# Patient Record
Sex: Male | Born: 1955 | Race: White | Hispanic: No | Smoking: Current every day smoker
Health system: Southern US, Community
[De-identification: ages and names within clinical notes are randomized; demographics above are authoritative.]

---

## 2009-07-21 ENCOUNTER — Emergency Department: Payer: Self-pay | Admitting: Emergency Medicine

## 2017-08-02 ENCOUNTER — Emergency Department
Admission: EM | Admit: 2017-08-02 | Discharge: 2017-08-02 | Payer: No Typology Code available for payment source | Attending: Emergency Medicine | Admitting: Emergency Medicine

## 2017-08-02 ENCOUNTER — Emergency Department: Payer: No Typology Code available for payment source

## 2017-08-02 ENCOUNTER — Encounter: Payer: Self-pay | Admitting: Emergency Medicine

## 2017-08-02 ENCOUNTER — Other Ambulatory Visit: Payer: Self-pay

## 2017-08-02 DIAGNOSIS — S0031XA Abrasion of nose, initial encounter: Secondary | ICD-10-CM | POA: Insufficient documentation

## 2017-08-02 DIAGNOSIS — F17210 Nicotine dependence, cigarettes, uncomplicated: Secondary | ICD-10-CM | POA: Insufficient documentation

## 2017-08-02 DIAGNOSIS — Z0289 Encounter for other administrative examinations: Secondary | ICD-10-CM | POA: Diagnosis present

## 2017-08-02 DIAGNOSIS — Y92410 Unspecified street and highway as the place of occurrence of the external cause: Secondary | ICD-10-CM | POA: Diagnosis not present

## 2017-08-02 DIAGNOSIS — Y999 Unspecified external cause status: Secondary | ICD-10-CM | POA: Diagnosis not present

## 2017-08-02 DIAGNOSIS — T148XXA Other injury of unspecified body region, initial encounter: Secondary | ICD-10-CM

## 2017-08-02 DIAGNOSIS — Y9389 Activity, other specified: Secondary | ICD-10-CM | POA: Insufficient documentation

## 2017-08-02 DIAGNOSIS — F10920 Alcohol use, unspecified with intoxication, uncomplicated: Secondary | ICD-10-CM | POA: Diagnosis not present

## 2017-08-02 LAB — COMPREHENSIVE METABOLIC PANEL
ALBUMIN: 4 g/dL (ref 3.5–5.0)
ALT: 12 U/L — ABNORMAL LOW (ref 17–63)
AST: 28 U/L (ref 15–41)
Alkaline Phosphatase: 75 U/L (ref 38–126)
Anion gap: 12 (ref 5–15)
BUN: 6 mg/dL (ref 6–20)
CHLORIDE: 102 mmol/L (ref 101–111)
CO2: 23 mmol/L (ref 22–32)
Calcium: 8.5 mg/dL — ABNORMAL LOW (ref 8.9–10.3)
Creatinine, Ser: 0.76 mg/dL (ref 0.61–1.24)
GFR calc Af Amer: >60 mL/min (ref >60)
GFR calc non Af Amer: >60 mL/min (ref >60)
Glucose, Bld: 110 mg/dL — ABNORMAL HIGH (ref 65–99)
POTASSIUM: 3.8 mmol/L (ref 3.5–5.1)
SODIUM: 137 mmol/L (ref 135–145)
Total Bilirubin: 0.3 mg/dL (ref 0.3–1.2)
Total Protein: 7.7 g/dL (ref 6.5–8.1)

## 2017-08-02 LAB — URINALYSIS, ROUTINE W REFLEX MICROSCOPIC
Bilirubin Urine: NEGATIVE
Glucose, UA: NEGATIVE mg/dL
Hgb urine dipstick: NEGATIVE
Ketones, ur: NEGATIVE mg/dL
Leukocytes, UA: NEGATIVE
Nitrite: NEGATIVE
Protein, ur: NEGATIVE mg/dL
SPECIFIC GRAVITY, URINE: 1.006 (ref 1.005–1.030)
pH: 7 (ref 5.0–8.0)

## 2017-08-02 LAB — CBC
HCT: 49.3 % (ref 40.0–52.0)
Hemoglobin: 16.9 g/dL (ref 13.0–18.0)
MCH: 34.7 pg — ABNORMAL HIGH (ref 26.0–34.0)
MCHC: 34.3 g/dL (ref 32.0–36.0)
MCV: 101.1 fL — ABNORMAL HIGH (ref 80.0–100.0)
PLATELETS: 226 10*3/uL (ref 150–440)
RBC: 4.88 MIL/uL (ref 4.40–5.90)
RDW: 15.2 % — ABNORMAL HIGH (ref 11.5–14.5)
WBC: 5.8 10*3/uL (ref 3.8–10.6)

## 2017-08-02 LAB — URINE DRUG SCREEN, QUALITATIVE (ARMC ONLY)
Amphetamines, Ur Screen: NOT DETECTED
Barbiturates, Ur Screen: NOT DETECTED
Benzodiazepine, Ur Scrn: NOT DETECTED
COCAINE METABOLITE, UR ~~LOC~~: NOT DETECTED
Cannabinoid 50 Ng, Ur ~~LOC~~: NOT DETECTED
MDMA (ECSTASY) UR SCREEN: NOT DETECTED
METHADONE SCREEN, URINE: NOT DETECTED
Opiate, Ur Screen: NOT DETECTED
Phencyclidine (PCP) Ur S: NOT DETECTED
TRICYCLIC, UR SCREEN: NOT DETECTED

## 2017-08-02 LAB — ETHANOL: ALCOHOL ETHYL (B): 326 mg/dL — AB (ref <10)

## 2017-08-02 NOTE — ED Notes (Signed)
Handcuffs removed for lab work

## 2017-08-02 NOTE — ED Notes (Signed)
Forensic blood draw completed, used betadine for prep, 2 lab tubes collected for officer [place].  Officers have search warrant present.
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
## 2017-08-02 NOTE — ED Notes (Signed)
Pt has small abrasion to left wrist from handcuffs, pt is not currently in handcuffs, wound cleansed with NS and wrapped with gauze.
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
## 2017-08-02 NOTE — ED Notes (Signed)
D/w Dr. Darnelle CatalanMalinda, new orders received.

## 2017-08-02 NOTE — ED Notes (Signed)
Pt seen and evaluated by Dr. Cyril LoosenKinner. Patient was released into the custody of [place] [place] [employer] [place]. Pt stable at the time of discharge.
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
## 2020-08-18 ENCOUNTER — Other Ambulatory Visit: Payer: Self-pay

## 2020-08-18 ENCOUNTER — Inpatient Hospital Stay
Admit: 2020-08-18 | Discharge: 2020-08-18 | Disposition: A | Discharge status: Home / Self Care | Payer: Self-pay | Attending: Internal Medicine | Admitting: Internal Medicine

## 2020-08-18 ENCOUNTER — Inpatient Hospital Stay
Admission: EM | Admit: 2020-08-18 | Discharge: 2020-08-20 | DRG: 871 | Disposition: A | Discharge status: Home / Self Care | Payer: Self-pay | Attending: Internal Medicine | Admitting: Internal Medicine

## 2020-08-18 ENCOUNTER — Emergency Department: Payer: Self-pay

## 2020-08-18 DIAGNOSIS — A419 Sepsis, unspecified organism: Principal | ICD-10-CM

## 2020-08-18 DIAGNOSIS — Z79899 Other long term (current) drug therapy: Secondary | ICD-10-CM

## 2020-08-18 DIAGNOSIS — J159 Unspecified bacterial pneumonia: Secondary | ICD-10-CM | POA: Diagnosis present

## 2020-08-18 DIAGNOSIS — U071 COVID-19: Secondary | ICD-10-CM | POA: Diagnosis present

## 2020-08-18 DIAGNOSIS — E876 Hypokalemia: Secondary | ICD-10-CM | POA: Diagnosis present

## 2020-08-18 DIAGNOSIS — Z720 Tobacco use: Secondary | ICD-10-CM

## 2020-08-18 DIAGNOSIS — J9601 Acute respiratory failure with hypoxia: Secondary | ICD-10-CM | POA: Diagnosis present

## 2020-08-18 DIAGNOSIS — F17210 Nicotine dependence, cigarettes, uncomplicated: Secondary | ICD-10-CM | POA: Diagnosis present

## 2020-08-18 DIAGNOSIS — E871 Hypo-osmolality and hyponatremia: Secondary | ICD-10-CM | POA: Diagnosis present

## 2020-08-18 DIAGNOSIS — R911 Solitary pulmonary nodule: Secondary | ICD-10-CM

## 2020-08-18 DIAGNOSIS — J189 Pneumonia, unspecified organism: Secondary | ICD-10-CM

## 2020-08-18 DIAGNOSIS — D6959 Other secondary thrombocytopenia: Secondary | ICD-10-CM | POA: Diagnosis present

## 2020-08-18 DIAGNOSIS — R6520 Severe sepsis without septic shock: Secondary | ICD-10-CM

## 2020-08-18 DIAGNOSIS — I5033 Acute on chronic diastolic (congestive) heart failure: Secondary | ICD-10-CM | POA: Diagnosis present

## 2020-08-18 DIAGNOSIS — A4189 Other specified sepsis: Principal | ICD-10-CM | POA: Diagnosis present

## 2020-08-18 DIAGNOSIS — J1282 Pneumonia due to coronavirus disease 2019: Secondary | ICD-10-CM

## 2020-08-18 DIAGNOSIS — E878 Other disorders of electrolyte and fluid balance, not elsewhere classified: Secondary | ICD-10-CM | POA: Diagnosis present

## 2020-08-18 DIAGNOSIS — F1010 Alcohol abuse, uncomplicated: Secondary | ICD-10-CM | POA: Diagnosis present

## 2020-08-18 DIAGNOSIS — I110 Hypertensive heart disease with heart failure: Secondary | ICD-10-CM | POA: Diagnosis present

## 2020-08-18 LAB — PROCALCITONIN
Procalcitonin: 2.58 ng/mL
Procalcitonin: 5.26 ng/mL

## 2020-08-18 LAB — COMPREHENSIVE METABOLIC PANEL
ALT: 18 U/L (ref 0–44)
AST: 47 U/L — ABNORMAL HIGH (ref 15–41)
Albumin: 3.4 g/dL — ABNORMAL LOW (ref 3.5–5.0)
Alkaline Phosphatase: 35 U/L — ABNORMAL LOW (ref 38–126)
Anion gap: 14 (ref 5–15)
BUN: 21 mg/dL (ref 8–23)
CO2: 22 mmol/L (ref 22–32)
Calcium: 8.4 mg/dL — ABNORMAL LOW (ref 8.9–10.3)
Chloride: 91 mmol/L — ABNORMAL LOW (ref 98–111)
Creatinine, Ser: 0.91 mg/dL (ref 0.61–1.24)
GFR, Estimated: >60 mL/min (ref >60)
Glucose, Bld: 189 mg/dL — ABNORMAL HIGH (ref 70–99)
Potassium: 3.3 mmol/L — ABNORMAL LOW (ref 3.5–5.1)
Sodium: 127 mmol/L — ABNORMAL LOW (ref 135–145)
Total Bilirubin: 0.8 mg/dL (ref 0.3–1.2)
Total Protein: 7.2 g/dL (ref 6.5–8.1)

## 2020-08-18 LAB — URINALYSIS, COMPLETE (UACMP) WITH MICROSCOPIC
Bacteria, UA: NONE SEEN
Bilirubin Urine: NEGATIVE
Glucose, UA: NEGATIVE mg/dL
Ketones, ur: 20 mg/dL — AB
Leukocytes,Ua: NEGATIVE
Nitrite: NEGATIVE
Protein, ur: 100 mg/dL — AB
Specific Gravity, Urine: 1.018 (ref 1.005–1.030)
pH: 5 (ref 5.0–8.0)

## 2020-08-18 LAB — LACTIC ACID, PLASMA
Lactic Acid, Venous: 5.1 mmol/L (ref 0.5–1.9)
Lactic Acid, Venous: 1.2 mmol/L (ref 0.5–1.9)

## 2020-08-18 LAB — CBC
HCT: 36.9 % — ABNORMAL LOW (ref 39.0–52.0)
Hemoglobin: 13.5 g/dL (ref 13.0–17.0)
MCH: 32.6 pg (ref 26.0–34.0)
MCHC: 36.6 g/dL — ABNORMAL HIGH (ref 30.0–36.0)
MCV: 89.1 fL (ref 80.0–100.0)
Platelets: 146 10*3/uL — ABNORMAL LOW (ref 150–400)
RBC: 4.14 MIL/uL — ABNORMAL LOW (ref 4.22–5.81)
RDW: 13.9 % (ref 11.5–15.5)
WBC: 7.4 10*3/uL (ref 4.0–10.5)
nRBC: 0 % (ref 0.0–0.2)

## 2020-08-18 LAB — CBC WITH DIFFERENTIAL/PLATELET
Abs Immature Granulocytes: 0.05 10*3/uL (ref 0.00–0.07)
Basophils Absolute: 0 10*3/uL (ref 0.0–0.1)
Basophils Relative: 0 %
Eosinophils Absolute: 0 10*3/uL (ref 0.0–0.5)
Eosinophils Relative: 0 %
HCT: 37.7 % — ABNORMAL LOW (ref 39.0–52.0)
Hemoglobin: 13.5 g/dL (ref 13.0–17.0)
Immature Granulocytes: 1 %
Lymphocytes Relative: 8 %
Lymphs Abs: 0.6 10*3/uL — ABNORMAL LOW (ref 0.7–4.0)
MCH: 32.5 pg (ref 26.0–34.0)
MCHC: 35.8 g/dL (ref 30.0–36.0)
MCV: 90.8 fL (ref 80.0–100.0)
Monocytes Absolute: 0.6 10*3/uL (ref 0.1–1.0)
Monocytes Relative: 7 %
Neutro Abs: 6.8 10*3/uL (ref 1.7–7.7)
Neutrophils Relative %: 84 %
Platelets: 129 10*3/uL — ABNORMAL LOW (ref 150–400)
RBC: 4.15 MIL/uL — ABNORMAL LOW (ref 4.22–5.81)
RDW: 13.8 % (ref 11.5–15.5)
WBC: 8.1 10*3/uL (ref 4.0–10.5)
nRBC: 0 % (ref 0.0–0.2)

## 2020-08-18 LAB — GLUCOSE, CAPILLARY: Glucose-Capillary: 163 mg/dL — ABNORMAL HIGH (ref 70–99)

## 2020-08-18 LAB — MAGNESIUM
Magnesium: 1.5 mg/dL — ABNORMAL LOW (ref 1.7–2.4)
Magnesium: 2.1 mg/dL (ref 1.7–2.4)

## 2020-08-18 LAB — PROTIME-INR
INR: 1.1 (ref 0.8–1.2)
Prothrombin Time: 13.3 seconds (ref 11.4–15.2)

## 2020-08-18 LAB — FIBRIN DERIVATIVES D-DIMER (ARMC ONLY): Fibrin derivatives D-dimer (ARMC): 2289.78 ng/mL (FEU) — ABNORMAL HIGH (ref 0.00–499.00)

## 2020-08-18 LAB — APTT: aPTT: 34 seconds (ref 24–36)

## 2020-08-18 LAB — FIBRINOGEN: Fibrinogen: 500 mg/dL — ABNORMAL HIGH (ref 210–475)

## 2020-08-18 LAB — LIPASE, BLOOD: Lipase: 28 U/L (ref 11–51)

## 2020-08-18 LAB — PHOSPHORUS: Phosphorus: 2 mg/dL — ABNORMAL LOW (ref 2.5–4.6)

## 2020-08-18 LAB — LACTATE DEHYDROGENASE: LDH: 223 U/L — ABNORMAL HIGH (ref 98–192)

## 2020-08-18 LAB — BRAIN NATRIURETIC PEPTIDE: B Natriuretic Peptide: 318.8 pg/mL — ABNORMAL HIGH (ref 0.0–100.0)

## 2020-08-18 LAB — C-REACTIVE PROTEIN: CRP: 15.5 mg/dL — ABNORMAL HIGH (ref <1.0)

## 2020-08-18 LAB — HIV ANTIBODY (ROUTINE TESTING W REFLEX): HIV Screen 4th Generation wRfx: NONREACTIVE

## 2020-08-18 LAB — TROPONIN I (HIGH SENSITIVITY): Troponin I (High Sensitivity): 62 ng/L — ABNORMAL HIGH (ref <18)

## 2020-08-18 LAB — SARS CORONAVIRUS 2 BY RT PCR (HOSPITAL ORDER, PERFORMED IN ~~LOC~~ HOSPITAL LAB): SARS Coronavirus 2: POSITIVE — AB

## 2020-08-18 LAB — FERRITIN: Ferritin: 1539 ng/mL — ABNORMAL HIGH (ref 24–336)

## 2020-08-18 MED ORDER — VITAMIN D 25 MCG (1000 UNIT) PO TABS
1000.0000 [IU] | ORAL_TABLET | Freq: Every day | ORAL | Status: DC
  Administered 2020-08-18 – 2020-08-20 (×3): 1000 [IU] via ORAL
  Filled 2020-08-18 (×3): qty 1

## 2020-08-18 MED ORDER — SODIUM CHLORIDE 0.9 % IV SOLN
200.0000 mg | Freq: Once | INTRAVENOUS | Status: AC
  Administered 2020-08-18: 200 mg via INTRAVENOUS
  Filled 2020-08-18: qty 200

## 2020-08-18 MED ORDER — LABETALOL HCL 5 MG/ML IV SOLN: 20.0000 mg | INTRAVENOUS | Status: DC | PRN

## 2020-08-18 MED ORDER — POTASSIUM CHLORIDE CRYS ER 20 MEQ PO TBCR
40.0000 meq | EXTENDED_RELEASE_TABLET | Freq: Once | ORAL | Status: AC
  Administered 2020-08-18: 40 meq via ORAL
  Filled 2020-08-18: qty 2

## 2020-08-18 MED ORDER — PREDNISONE 50 MG PO TABS: 50.0000 mg | ORAL_TABLET | Freq: Every day | ORAL | Status: DC

## 2020-08-18 MED ORDER — CHLORDIAZEPOXIDE HCL 25 MG PO CAPS
25.0000 mg | ORAL_CAPSULE | Freq: Once | ORAL | Status: AC
  Administered 2020-08-18: 25 mg via ORAL
  Filled 2020-08-18: qty 1

## 2020-08-18 MED ORDER — FOLIC ACID 1 MG PO TABS
1.0000 mg | ORAL_TABLET | Freq: Every day | ORAL | Status: DC
  Administered 2020-08-18 – 2020-08-20 (×3): 1 mg via ORAL
  Filled 2020-08-18 (×3): qty 1

## 2020-08-18 MED ORDER — ALBUTEROL SULFATE HFA 108 (90 BASE) MCG/ACT IN AERS
2.0000 | INHALATION_SPRAY | Freq: Four times a day (QID) | RESPIRATORY_TRACT | Status: DC | PRN
  Filled 2020-08-18: qty 6.7

## 2020-08-18 MED ORDER — ADULT MULTIVITAMIN W/MINERALS CH
1.0000 | ORAL_TABLET | Freq: Every day | ORAL | Status: DC
  Administered 2020-08-18 – 2020-08-20 (×3): 1 via ORAL
  Filled 2020-08-18 (×3): qty 1

## 2020-08-18 MED ORDER — AMLODIPINE BESYLATE 5 MG PO TABS
5.0000 mg | ORAL_TABLET | Freq: Every day | ORAL | Status: DC
  Administered 2020-08-18 – 2020-08-20 (×3): 5 mg via ORAL
  Filled 2020-08-18 (×3): qty 1

## 2020-08-18 MED ORDER — FAMOTIDINE 20 MG PO TABS
20.0000 mg | ORAL_TABLET | Freq: Two times a day (BID) | ORAL | Status: DC
  Administered 2020-08-18 – 2020-08-20 (×5): 20 mg via ORAL
  Filled 2020-08-18 (×5): qty 1

## 2020-08-18 MED ORDER — POTASSIUM CHLORIDE 10 MEQ/100ML IV SOLN: 10.0000 meq | INTRAVENOUS | Status: DC

## 2020-08-18 MED ORDER — ONDANSETRON HCL 4 MG PO TABS: 4.0000 mg | ORAL_TABLET | Freq: Four times a day (QID) | ORAL | Status: DC | PRN

## 2020-08-18 MED ORDER — CHOLECALCIFEROL 1.25 MG (50000 UT) PO CAPS: 50000.0000 [IU] | ORAL_CAPSULE | ORAL | Status: DC

## 2020-08-18 MED ORDER — ZINC SULFATE 220 (50 ZN) MG PO CAPS
220.0000 mg | ORAL_CAPSULE | Freq: Every day | ORAL | Status: DC
  Administered 2020-08-18 – 2020-08-20 (×3): 220 mg via ORAL
  Filled 2020-08-18 (×3): qty 1

## 2020-08-18 MED ORDER — ENOXAPARIN SODIUM 40 MG/0.4ML ~~LOC~~ SOLN
40.0000 mg | SUBCUTANEOUS | Status: DC
  Administered 2020-08-19 – 2020-08-20 (×2): 40 mg via SUBCUTANEOUS
  Filled 2020-08-18 (×3): qty 0.4

## 2020-08-18 MED ORDER — ONDANSETRON HCL 4 MG/2ML IJ SOLN: 4.0000 mg | Freq: Four times a day (QID) | INTRAMUSCULAR | Status: DC | PRN

## 2020-08-18 MED ORDER — LORAZEPAM 2 MG/ML IJ SOLN: 1.0000 mg | INTRAMUSCULAR | Status: DC | PRN

## 2020-08-18 MED ORDER — SODIUM CHLORIDE 0.9 % IV SOLN
500.0000 mg | INTRAVENOUS | Status: DC
  Administered 2020-08-18 – 2020-08-20 (×3): 500 mg via INTRAVENOUS
  Filled 2020-08-18 (×4): qty 500

## 2020-08-18 MED ORDER — POTASSIUM CHLORIDE IN NACL 20-0.9 MEQ/L-% IV SOLN: INTRAVENOUS | Status: DC

## 2020-08-18 MED ORDER — THIAMINE HCL 100 MG PO TABS
100.0000 mg | ORAL_TABLET | Freq: Every day | ORAL | Status: DC
  Administered 2020-08-18 – 2020-08-20 (×3): 100 mg via ORAL
  Filled 2020-08-18 (×3): qty 1

## 2020-08-18 MED ORDER — MAGNESIUM HYDROXIDE 400 MG/5ML PO SUSP
30.0000 mL | Freq: Every day | ORAL | Status: DC | PRN
  Filled 2020-08-18: qty 30

## 2020-08-18 MED ORDER — DEXAMETHASONE SODIUM PHOSPHATE 10 MG/ML IJ SOLN
6.0000 mg | Freq: Once | INTRAMUSCULAR | Status: AC
  Administered 2020-08-18: 6 mg via INTRAVENOUS
  Filled 2020-08-18: qty 1

## 2020-08-18 MED ORDER — LACTATED RINGERS IV BOLUS (SEPSIS)
2000.0000 mL | Freq: Once | INTRAVENOUS | Status: AC
  Administered 2020-08-18: 2000 mL via INTRAVENOUS

## 2020-08-18 MED ORDER — ACETAMINOPHEN 500 MG PO TABS
1000.0000 mg | ORAL_TABLET | Freq: Once | ORAL | Status: AC
  Administered 2020-08-18: 1000 mg via ORAL
  Filled 2020-08-18: qty 2

## 2020-08-18 MED ORDER — MIRTAZAPINE 15 MG PO TABS
15.0000 mg | ORAL_TABLET | Freq: Every day | ORAL | Status: DC
Start: 2020-08-18 — End: 2020-08-20
  Administered 2020-08-18 – 2020-08-19 (×2): 15 mg via ORAL
  Filled 2020-08-18 (×2): qty 1

## 2020-08-18 MED ORDER — LACTATED RINGERS IV SOLN: INTRAVENOUS | Status: AC

## 2020-08-18 MED ORDER — HYDROCOD POLST-CPM POLST ER 10-8 MG/5ML PO SUER: 5.0000 mL | Freq: Two times a day (BID) | ORAL | Status: DC | PRN

## 2020-08-18 MED ORDER — FUROSEMIDE 10 MG/ML IJ SOLN
40.0000 mg | Freq: Once | INTRAMUSCULAR | Status: AC
  Administered 2020-08-18: 40 mg via INTRAVENOUS
  Filled 2020-08-18: qty 4

## 2020-08-18 MED ORDER — METHYLPREDNISOLONE SODIUM SUCC 125 MG IJ SOLR
1.0000 mg/kg | Freq: Two times a day (BID) | INTRAMUSCULAR | Status: DC
  Administered 2020-08-18 – 2020-08-19 (×2): 83.75 mg via INTRAVENOUS
  Filled 2020-08-18 (×2): qty 2

## 2020-08-18 MED ORDER — LOSARTAN POTASSIUM 25 MG PO TABS
25.0000 mg | ORAL_TABLET | Freq: Every day | ORAL | Status: DC
  Administered 2020-08-18 – 2020-08-20 (×3): 25 mg via ORAL
  Filled 2020-08-18 (×3): qty 1

## 2020-08-18 MED ORDER — POTASSIUM CHLORIDE 10 MEQ/100ML IV SOLN
10.0000 meq | Freq: Once | INTRAVENOUS | Status: AC
  Administered 2020-08-18: 10 meq via INTRAVENOUS
  Filled 2020-08-18 (×2): qty 100

## 2020-08-18 MED ORDER — ACETAMINOPHEN 325 MG PO TABS: 650.0000 mg | ORAL_TABLET | Freq: Four times a day (QID) | ORAL | Status: DC | PRN

## 2020-08-18 MED ORDER — LORAZEPAM 1 MG PO TABS: 1.0000 mg | ORAL_TABLET | ORAL | Status: DC | PRN

## 2020-08-18 MED ORDER — ASPIRIN EC 81 MG PO TBEC
81.0000 mg | DELAYED_RELEASE_TABLET | Freq: Every day | ORAL | Status: DC
  Administered 2020-08-18 – 2020-08-20 (×3): 81 mg via ORAL
  Filled 2020-08-18 (×3): qty 1

## 2020-08-18 MED ORDER — TRAZODONE HCL 50 MG PO TABS
25.0000 mg | ORAL_TABLET | Freq: Every evening | ORAL | Status: DC | PRN
  Filled 2020-08-18: qty 1

## 2020-08-18 MED ORDER — SODIUM CHLORIDE 0.9 % IV SOLN
100.0000 mg | Freq: Every day | INTRAVENOUS | Status: DC
  Administered 2020-08-19 – 2020-08-20 (×2): 100 mg via INTRAVENOUS
  Filled 2020-08-18 (×2): qty 20

## 2020-08-18 MED ORDER — MAGNESIUM SULFATE 2 GM/50ML IV SOLN
2.0000 g | Freq: Once | INTRAVENOUS | Status: AC
  Administered 2020-08-18: 2 g via INTRAVENOUS
  Filled 2020-08-18: qty 50

## 2020-08-18 MED ORDER — ASCORBIC ACID 500 MG PO TABS
500.0000 mg | ORAL_TABLET | Freq: Every day | ORAL | Status: DC
  Administered 2020-08-18 – 2020-08-20 (×3): 500 mg via ORAL
  Filled 2020-08-18 (×3): qty 1

## 2020-08-18 MED ORDER — GUAIFENESIN-DM 100-10 MG/5ML PO SYRP: 10.0000 mL | ORAL_SOLUTION | ORAL | Status: DC | PRN

## 2020-08-18 MED ORDER — SODIUM CHLORIDE 0.9 % IV SOLN
2.0000 g | INTRAVENOUS | Status: DC
  Administered 2020-08-18 – 2020-08-20 (×3): 2 g via INTRAVENOUS
  Filled 2020-08-18 (×4): qty 20

## 2020-08-18 NOTE — Progress Notes (Signed)
*  PRELIMINARY RESULTS* Echocardiogram 2D Echocardiogram has been performed.  Gary Benton Agnes 08/18/2020, 11:25 AM

## 2020-08-18 NOTE — ED Triage Notes (Signed)
Pt presents to ER via [facility] c/o weakness, cough, unable to walk x3 days.  Pt tachypneic, tachycardic, and febrile on arrival.  Per ems, pt's temp was 102.9 en route.  Pt denies any pain at this time.  Pt does endorse drinking tequila on a regular basis, but does not remember last time he drank anything.  PT A&O x4 at this time.

## 2020-08-18 NOTE — Sepsis Progress Note (Addendum)
Following for sepsis monitoring   Notified bedside nurse of need to draw repeat lactic acid.

## 2020-08-18 NOTE — ED Notes (Signed)
Pt given cell phone, charger and shirt brought by visitor.

## 2020-08-18 NOTE — Progress Notes (Signed)
CODE SEPSIS - PHARMACY COMMUNICATION  **Broad Spectrum Antibiotics should be administered within 1 hour of Sepsis diagnosis**  Time Code Sepsis Called/Page Received: 0128  Antibiotics Ordered: Ceftriaxone and Azithromycin  Time of 1st antibiotic administration: 0155  Otelia Sergeant, PharmD, [facility] 08/18/2020 2:19 AM

## 2020-08-18 NOTE — Sepsis Progress Note (Signed)
Sepsis monitoring complete 

## 2020-08-18 NOTE — Progress Notes (Signed)
Remdesivir - Pharmacy Brief Note   O:  CXR:"Bilateral rounded pulmonary opacities that appear to be new since 2019. Follow-up with an outpatient CT is recommended for further Evaluation." SpO2: 92-94% on RA   A/P:  Remdesivir 200 mg IVPB once followed by 100 mg IVPB daily x 4 days.   Otelia Sergeant, PharmD, [facility] 08/18/2020 3:02 AM

## 2020-08-18 NOTE — Progress Notes (Signed)
PROGRESS NOTE    Gary So.  HFW:[id number] DOB: 11-27-1955 DOA: 08/18/2020 PCP: [facility]    Brief Narrative:  Gary Benton  is a 65 y.o. Caucasian male with a known history of hypertension, ongoing tobacco and alcohol abuse, who presented to the emergency room with acute onset of worsening dyspnea with productive cough of yellowish sputum as well as generalized weakness which has been going on over the last couple weeks.  Upon arriving the emergency room, his oxygen saturation was 87%, was placed on 2 L oxygen.  Patient positive for Covid.  Chest x-ray showed a possible lung nodule.  Patient is treated with steroids and remdesivir.   Assessment & Plan:   Active Problems:   Sepsis due to COVID-19 (HCC)  #1.  Acute hypoxemic respiratory failure secondary to Covid and acute on chronic congestive heart failure. Has elevation of BNP, will obtain echocardiogram to evaluate LV systolic function.  Start IV Lasix.  Discontinue fluids.  2.  Covid infection. Sepsis secondary to bacterial pneumonia. Bacterial pneumonia. Patient has a profound elevation of procalcitonin level in the setting of Covid infection.  This is consistent with bacterial pneumonia. Continue Rocephin and Zithromax.  Culture pending. Continue steroids and remdesivir for Covid infection.  3.  Alcohol abuse. Continue as needed Ativan and a multivitamin.  4.  Essential hypertension. Continue as needed blood pressure medicine.  5.  Hyponatremia and hypokalemia and hypomagnesemia. Supplement potassium and magnesium.  Recheck a BMP tomorrow    DVT prophylaxis: Lovenox Code Status: Fu;; Family Communication:  Disposition Plan:  .   Status is: Inpatient  Remains inpatient appropriate because:Inpatient level of care appropriate due to severity of illness   Dispo: The patient is from: Home              Anticipated d/c is to: Home              Anticipated d/c date is: 1 day               Patient currently is not medically stable to d/c.   Difficult to place patient No        I/O last 3 completed shifts: In: 2350 [IV Piggyback:2350] Out: 500 [Urine:500] Total I/O In: 339.8 [IV Piggyback:339.8] Out: -      Consultants:   None  Procedures:   Antimicrobials: Rocephin and Zithromax Subjective: Patient feels better this morning, short of breath improving. No chest pain, no cough. No fever chills pain No dysuria hematuria   Objective: Vitals:   08/18/20 0900 08/18/20 0930 08/18/20 1030 08/18/20 1100  BP: 134/78 (!) 167/91 (!) 148/86 139/80  Pulse: 81 84 90 86  Resp: 17 16  20   Temp:      TempSrc:      SpO2: 94% (!) 89% 99% 95%  Weight:      Height:        Intake/Output Summary (Last 24 hours) at 08/18/2020 1246 Last data filed at 08/18/2020 0945 Gross per 24 hour  Intake 2689.83 ml  Output 500 ml  Net 2189.83 ml   Filed Weights   08/18/20 0128  Weight: 83.9 kg    Examination:  General exam: Appears calm and comfortable  Respiratory system: Clear to auscultation. Respiratory effort normal. Cardiovascular system: S1 & S2 heard, RRR. No JVD, murmurs, rubs, gallops or clicks. No pedal edema. Gastrointestinal system: Abdomen is nondistended, soft and nontender. No organomegaly or masses felt. Normal bowel sounds heard. Central nervous system: Alert  and oriented. No focal neurological deficits. Extremities: Symmetric 5 x 5 power. Skin: No rashes, lesions or ulcers Psychiatry:  Mood & affect appropriate.     Data Reviewed: I have personally reviewed following labs and imaging studies  CBC: Recent Labs  Lab 08/18/20 0142  WBC 8.1  NEUTROABS 6.8  HGB 13.5  HCT 37.7*  MCV 90.8  PLT 129*   Basic Metabolic Panel: Recent Labs  Lab 08/18/20 0243  NA 127*  K 3.3*  CL 91*  CO2 22  GLUCOSE 189*  BUN 21  CREATININE 0.91  CALCIUM 8.4*  MG 1.5*   GFR: Estimated Creatinine Clearance: 98.6 mL/min (by C-G formula based on  SCr of 0.91 mg/dL). Liver Function Tests: Recent Labs  Lab 08/18/20 0243  AST 47*  ALT 18  ALKPHOS 35*  BILITOT 0.8  PROT 7.2  ALBUMIN 3.4*   Recent Labs  Lab 08/18/20 0243  LIPASE 28   No results for input(s): AMMONIA in the last 168 hours. Coagulation Profile: Recent Labs  Lab 08/18/20 0142  INR 1.1   Cardiac Enzymes: No results for input(s): CKTOTAL, CKMB, CKMBINDEX, TROPONINI in the last 168 hours. BNP (last 3 results) No results for input(s): PROBNP in the last 8760 hours. HbA1C: No results for input(s): HGBA1C in the last 72 hours. CBG: No results for input(s): GLUCAP in the last 168 hours. Lipid Profile: No results for input(s): CHOL, HDL, LDLCALC, TRIG, CHOLHDL, LDLDIRECT in the last 72 hours. Thyroid Function Tests: No results for input(s): TSH, T4TOTAL, FREET4, T3FREE, THYROIDAB in the last 72 hours. Anemia Panel: Recent Labs    08/18/20 0633  FERRITIN 1,539*   Sepsis Labs: Recent Labs  Lab 08/18/20 0142 08/18/20 0243 08/18/20 0349 08/18/20 0633  PROCALCITON  --  2.58  --  5.26  LATICACIDVEN 5.1*  --  1.2  --     Recent Results (from the past 240 hour(s))  Blood Culture (routine x 2)     Status: None (Preliminary result)   Collection Time: 08/18/20  1:43 AM   Specimen: BLOOD  Result Value Ref Range Status   Specimen Description BLOOD RIGHT ANTECUBITAL  Final   Special Requests   Final    BOTTLES DRAWN AEROBIC AND ANAEROBIC Blood Culture results may not be optimal due to an excessive volume of blood received in culture bottles   Culture   Final    NO GROWTH < 12 HOURS Performed at [facility], [address], [place], [place] [address]    Report Status PENDING  Incomplete  Blood Culture (routine x 2)     Status: None (Preliminary result)   Collection Time: 08/18/20  1:43 AM   Specimen: BLOOD  Result Value Ref Range Status   Specimen Description BLOOD WRIST  Final   Special Requests   Final    BOTTLES DRAWN AEROBIC AND  ANAEROBIC Blood Culture results may not be optimal due to an inadequate volume of blood received in culture bottles   Culture   Final    NO GROWTH < 12 HOURS Performed at [facility], [address] [place], [place] [address]    Report Status PENDING  Incomplete  SARS Coronavirus 2 by RT PCR (hospital order, performed in [facility] hospital lab) Nasopharyngeal Nasopharyngeal Swab     Status: Abnormal   Collection Time: 08/18/20  1:43 AM   Specimen: Nasopharyngeal Swab  Result Value Ref Range Status   SARS Coronavirus 2 POSITIVE (A) NEGATIVE Final    Comment: RESULT  CALLED TO, READ BACK BY AND VERIFIED WITH: AUSTIN REEVES @0249  08/18/20 RH (NOTE) SARS-CoV-2 target nucleic acids are DETECTED  SARS-CoV-2 RNA is generally detectable in upper respiratory specimens  during the acute phase of infection.  Positive results are indicative  of the presence of the identified virus, but do not rule out bacterial infection or co-infection with other pathogens not detected by the test.  Clinical correlation with patient history and  other diagnostic information is necessary to determine patient infection status.  The expected result is negative.  Fact Sheet for Patients:   08/20/20   Fact Sheet for Healthcare Providers:   BoilerBrush.com.cy    This test is not yet approved or cleared by the https://pope.com/ FDA and  has been authorized for detection and/or diagnosis of SARS-CoV-2 by FDA under an Emergency Use Authorization (EUA).  This EUA will remain in effect (meaning this test c an be used) for the duration of  the COVID-19 declaration under Section 564(b)(1) of the Act, 21 U.S.C. section 360-bbb-3(b)(1), unless the authorization is terminated or revoked sooner.  Performed at [facility], [address] [place], [place]          Radiology Studies: [facility] Chest [place] 1 View  Result Date:  08/18/2020 CLINICAL DATA:  Weakness.  Cough. EXAM: PORTABLE CHEST 1 VIEW COMPARISON:  August 02, 2017 FINDINGS: There is scarring and atelectasis at the lung bases, not substantially changed from prior study. There are few rounded opacities at the right lower lung zone that appear to be new since prior study. There is a pulmonary nodule in the left mid lung zone. There is no pneumothorax. No large pleural effusion. The heart size is stable. Aortic calcifications are noted. There is no acute osseous abnormality. IMPRESSION: 1. No definite acute cardiopulmonary process. 2. Bilateral rounded pulmonary opacities that appear to be new since 2019. Follow-up with an outpatient CT is recommended for further evaluation. Electronically Signed   By: 2020 M.D.   On: 08/18/2020 01:59        Scheduled Meds: . amLODipine  5 mg Oral Daily  . vitamin C  500 mg Oral Daily  . aspirin EC  81 mg Oral Daily  . cholecalciferol  1,000 Units Oral Daily  . enoxaparin (LOVENOX) injection  40 mg Subcutaneous Q24H  . famotidine  20 mg Oral BID  . folic acid  1 mg Oral Daily  . losartan  25 mg Oral Daily  . methylPREDNISolone (SOLU-MEDROL) injection  1 mg/kg Intravenous Q12H   Followed by  . [START ON 08/21/2020] predniSONE  50 mg Oral Daily  . mirtazapine  15 mg Oral QHS  . multivitamin with minerals  1 tablet Oral Daily  . thiamine  100 mg Oral Daily  . zinc sulfate  220 mg Oral Daily   Continuous Infusions: . azithromycin Stopped (08/18/20 0348)  . cefTRIAXone (ROCEPHIN)  IV Stopped (08/18/20 0240)  . lactated ringers 150 mL/hr at 08/18/20 0352  . potassium chloride    . [START ON 08/19/2020] remdesivir 100 mg in NS 100 mL       LOS: 0 days    Time spent:     08/21/2020, MD [facility]   To contact the attending provider between 7A-7P or the covering provider during after hours 7P-7A, please log into the web site [email or web address] and access using universal [place] password for  that web site. If you do not have the password, please call the hospital operator.  08/18/2020, 12:46 PM

## 2020-08-18 NOTE — ED Notes (Signed)
Pt had loose, watery BM at this time.  Pt's bed linens changed, and new hospital gown and blankets given to pt.

## 2020-08-18 NOTE — H&P (Signed)
[place]   PATIENT NAME: Gary Benton    MR#:  [id number]  DATE OF BIRTH:  Curlie Macken 12, 1957  DATE OF ADMISSION:  08/18/2020  PRIMARY CARE PHYSICIAN: [facility]   REQUESTING/REFERRING PHYSICIAN: Delton Prairie, MD  CHIEF COMPLAINT:   Chief Complaint  Patient presents with  . Code Sepsis    HISTORY OF PRESENT ILLNESS:  Corbitt Cloke  is a 65 y.o. Caucasian male with a known history of hypertension, ongoing tobacco and alcohol abuse, who presented to the emergency room with acute onset of worsening dyspnea with productive cough of yellowish sputum as well as generalized weakness which has been going on over the last couple weeks.  He admitted to diarrhea without nausea or vomiting.  No loss of taste or smell.  He denied any chest pain or palpitations.  No dysuria, oliguria or hematuria or flank pain.  His last alcoholic drink was yesterday.  He usually drinks about 2 shots per day.  He has not been vaccinated for COVID-19.  When he came to the ER attention was one 2.9 with a blood pressure 159/73, heart rate of 118 and respiratory to 35 with pulse currently 92% on room air.  And has these dropped to 87% on room air he was placed on 2 days of O2 by nasal cannula.  Labs revealed hyponatremia and hypochloremia, hypokalemia and hypomagnesemia, blood glucose of 189 and albumin 3.4.  Lactic acid was 5.1 and later 1.2.  CBC showed no significant abnormalities.  COVID-19 PCR came back positive UA was unremarkable.  Blood and urine cultures were drawn.  Portable chest x-ray showed bilateral rounded pulmonary opacities that appear to be new since 2019 with recommendation for follow-up on an outpatient basis with CT scan for further evaluation.  The patient was given 1 g of p.o. Tylenol, IV Rocephin and Zithromax, Librium, 6 mg IV Decadron, 2 L bolus of IV lactated Ringer followed by 150 mL/h, 2 g of IV magnesium sulfate, 40 mg of p.o. potassium chloride as well as IV  remdesivir.  He will be admitted to a medical monitored isolation bed for further evaluation and management.  PAST MEDICAL HISTORY:  Hypertension, tobacco and alcohol abuse.  PAST SURGICAL HISTORY:  History reviewed. No pertinent surgical history.  No reported previous surgeries.  SOCIAL HISTORY:   Social History   Tobacco Use  . Smoking status: Current Every Day Smoker    Packs/day: 1.00    Types: Cigarettes  . Smokeless tobacco: Not on file  Substance Use Topics  . Alcohol use: Not on file    FAMILY HISTORY:  History reviewed. No pertinent family history.   DRUG ALLERGIES:  No Known Allergies  REVIEW OF SYSTEMS:   ROS As per history of present illness. All pertinent systems were reviewed above. Constitutional, HEENT, cardiovascular, respiratory, GI, GU, musculoskeletal, neuro, psychiatric, endocrine, integumentary and hematologic systems were reviewed and are otherwise negative/unremarkable except for positive findings mentioned above in the HPI.   MEDICATIONS AT HOME:   Prior to Admission medications   Medication Sig Start Date End Date Taking? Authorizing Provider  amLODipine (NORVASC) 5 MG tablet Take 1 tablet by mouth once a day  TAKE 1 TABLET BY MOUTH DAILY FOR HIGH BLOOD PRESSURE. 07/19/20   [provider]  D3-50 1.25 MG (50000 UT) capsule Take 50,000 Units by mouth once a week. 07/20/20   [provider]  losartan (COZAAR) 25 MG tablet Take 1 tablet by mouth once a day  1 by mouth daily for high blood pressure 07/19/20   [provider]  PROAIR HFA 108 (90 Base) MCG/ACT inhaler Inhale 2 puff using inhaler every four hours as needed as needed for cough, shortness of breath or wheezing 08/11/20   [provider]  REMERON 15 MG tablet Take 1 tablet by mouth every night  take one by mouth at bedtime 07/19/20   [provider]      VITAL SIGNS:  Blood pressure 139/73, pulse 96, temperature (!) 101.3 F (38.5 C),  temperature source Oral, resp. rate 20, height 6\' 3"  (1.905 m), weight 83.9 kg, SpO2 92 %.  PHYSICAL EXAMINATION:  Physical Exam  GENERAL:  65 y.o.-year-old Caucasian male patient lying in the bed with no acute distress.  EYES: Pupils equal, round, reactive to light and accommodation. No scleral icterus. Extraocular muscles intact.  HEENT: Head atraumatic, normocephalic. Oropharynx and nasopharynx clear.  NECK:  Supple, no jugular venous distention. No thyroid enlargement, no tenderness.  LUNGS: Minimally diminished bibasal breath sounds.   CARDIOVASCULAR: Regular rate and rhythm, S1, S2 normal. No murmurs, rubs, or gallops.  ABDOMEN: Soft, nondistended, nontender. Bowel sounds present. No organomegaly or mass.  EXTREMITIES: No pedal edema, cyanosis, or clubbing.  NEUROLOGIC: Cranial nerves II through XII are intact. Muscle strength 5/5 in all extremities. Sensation intact. Gait not checked.  PSYCHIATRIC: The patient is alert and oriented x 3.  Normal affect and good eye contact. SKIN: No obvious rash, lesion, or ulcer.   LABORATORY PANEL:   CBC Recent Labs  Lab 08/18/20 0142  WBC 8.1  HGB 13.5  HCT 37.7*  PLT 129*   ------------------------------------------------------------------------------------------------------------------  Chemistries  Recent Labs  Lab 08/18/20 0243  NA 127*  K 3.3*  CL 91*  CO2 22  GLUCOSE 189*  BUN 21  CREATININE 0.91  CALCIUM 8.4*  MG 1.5*  AST 47*  ALT 18  ALKPHOS 35*  BILITOT 0.8   ------------------------------------------------------------------------------------------------------------------  Cardiac Enzymes No results for input(s): TROPONINI in the last 168 hours. ------------------------------------------------------------------------------------------------------------------  RADIOLOGY:  DG Chest Port 1 View  Result Date: 08/18/2020 CLINICAL DATA:  Weakness.  Cough. EXAM: PORTABLE CHEST 1 VIEW COMPARISON:  August 02, 2017  FINDINGS: There is scarring and atelectasis at the lung bases, not substantially changed from prior study. There are few rounded opacities at the right lower lung zone that appear to be new since prior study. There is a pulmonary nodule in the left mid lung zone. There is no pneumothorax. No large pleural effusion. The heart size is stable. Aortic calcifications are noted. There is no acute osseous abnormality. IMPRESSION: 1. No definite acute cardiopulmonary process. 2. Bilateral rounded pulmonary opacities that appear to be new since 2019. Follow-up with an outpatient CT is recommended for further evaluation. Electronically Signed   By: 2020 M.D.   On: 08/18/2020 01:59      IMPRESSION AND PLAN:   1.  Acute hypoxemic respiratory failure secondary to COVID-19. -The patient will be admitted to a medically monitored isolation bed. -O2 protocol will be followed to keep O2 saturation above 93.   2.    Suspect pneumonia secondary to COVID-19. -The patient will be admitted to an isolation monitored bed with droplet and contact precautions. -Given multifocal pneumonia we will empirically place the patient on IV Rocephin and Zithromax for possible bacterial superinfection only with elevated Procalcitonin. -The patient will be placed on scheduled Mucinex and as needed Tussionex. -We will avoid nebulization as much as we can, give  bronchodilator MDI if needed, and with deterioration of oxygenation try to avoid BiPAP/CPAP if possible.    -Will obtain sputum Gram stain culture and sensitivity and follow blood cultures. -O2 protocol will be followed. -We will follow CRP, ferritin, LDH and D-dimer. -Will follow manual differential for ANC/ALC ratio as well as follow troponin I and daily CBC with manual differential and CMP. - Will place the patient on IV Remdesivir and IV steroid therapy with IV Solu-Medrol with elevated inflammatory markers. -The patient will be placed on vitamin D3, vitamin C,  zinc sulfate, p.o. Pepcid and aspirin.  3.  Alcohol abuse. -The patient will be placed on multivitamins, folic acid and thiamine. -He will be placed on as needed IV Ativan for withdrawal. -He was counseled for cessation.  4.  Tobacco abuse. -He will be counseled for cessation here.  5.  Hypertension. -We will place him on as needed IV labetalol.  6.  DVT prophylaxis. -Subcutaneous Lovenox.   All the records are reviewed and case discussed with ED provider. The plan of care was discussed in details with the patient (and family). I answered all questions. The patient agreed to proceed with the above mentioned plan. Further management will depend upon hospital course.   CODE STATUS: Full code  Status is: Inpatient  Remains inpatient appropriate because:Ongoing diagnostic testing needed not appropriate for outpatient work up, Unsafe d/c plan, IV treatments appropriate due to intensity of illness or inability to take PO and Inpatient level of care appropriate due to severity of illness   Dispo: The patient is from: Home              Anticipated d/c is to: Home              Anticipated d/c date is: 3 days              Patient currently is not medically stable to d/c.   Difficult to place patient No.  TOTAL TIME TAKING CARE OF THIS PATIENT: 55 minutes.    Hannah Beat M.D on 08/18/2020 at 6:27 AM  [facility]   From 7 PM-7 AM, contact night-coverage [email or web address]  CC: Primary care physician; [facility]

## 2020-08-18 NOTE — ED Provider Notes (Signed)
[facility] Emergency Department Provider Note ____________________________________________   Event Date/Time   First MD Initiated Contact with Patient 08/18/20 0124     (approximate)  I have reviewed the triage vital signs and the nursing notes.  HISTORY  Chief Complaint Code Sepsis   HPI Gary Benton. is a 65 y.o. malewho presents to the ED for evaluation of generalized weakness and possible sepsis.  Chart review indicates alcohol and tobacco abuse.  HTN. Patient is not vaccinated against COVID-19.  Patient presents to the ED as a field sepsis alert, he reports about 4 days of generalized weakness, increased shortness of breath, poor p.o. intake and productive cough of green and yellow sputum.  Reports associated generalized chills without documented fevers.  Denies chest pain, syncope, abdominal pain, emesis.   Patient reports that he is a daily alcohol drinker and he cannot recall the last time he did not have a beverage in a day.  1 PPD smoker.  No other recreational drugs.  History reviewed. No pertinent past medical history.  There are no problems to display for this patient.   History reviewed. No pertinent surgical history.  Prior to Admission medications   Medication Sig Start Date End Date Taking? Authorizing Provider  amLODipine (NORVASC) 5 MG tablet Take 1 tablet by mouth once a day  TAKE 1 TABLET BY MOUTH DAILY FOR HIGH BLOOD PRESSURE. 07/19/20   [provider]  D3-50 1.25 MG (50000 UT) capsule Take 50,000 Units by mouth once a week. 07/20/20   [provider]  losartan (COZAAR) 25 MG tablet Take 1 tablet by mouth once a day  1 by mouth daily for high blood pressure 07/19/20   [provider]  PROAIR HFA 108 (90 Base) MCG/ACT inhaler Inhale 2 puff using inhaler every four hours as needed as needed for cough, shortness of breath or wheezing 08/11/20   [provider]  REMERON 15 MG tablet Take 1  tablet by mouth every night  take one by mouth at bedtime 07/19/20   [provider]    Allergies Patient has no known allergies.  History reviewed. No pertinent family history.  Social History Social History   Tobacco Use  . Smoking status: Current Every Day Smoker    Packs/day: 1.00    Types: Cigarettes    Review of Systems  Constitutional: Positive for subjective chills. Eyes: No visual changes. ENT: No sore throat. Cardiovascular: Denies chest pain. Respiratory: Positive for shortness of breath and productive cough Gastrointestinal: No abdominal pain. no vomiting.  No constipation.  Positive for poor p.o. intake and nausea Genitourinary: Negative for dysuria. Musculoskeletal: Negative for back pain. Skin: Negative for rash. Neurological: Negative for headaches, focal weakness or numbness.  ____________________________________________   PHYSICAL EXAM:  VITAL SIGNS: Vitals:   08/18/20 0248 08/18/20 0353  BP:  139/73  Pulse:  96  Resp:  20  Temp: (!) 101.3 F (38.5 C)   SpO2:  92%      Constitutional: Alert and oriented.  Sitting up in bed and appears uncomfortable.  Obviously tachypneic and dyspneic.  Speaking in phrases only. Eyes: Conjunctivae are normal. PERRL. EOMI. Head: Atraumatic. Nose: No congestion/rhinnorhea. Mouth/Throat: Mucous membranes are dry.  No tongue fasciculations.  Oropharynx non-erythematous. Neck: No stridor. No cervical spine tenderness to palpation. Cardiovascular: Tachycardic rate, regular rhythm. Grossly normal heart sounds.  Good peripheral circulation. Respiratory: Tachypneic to the low 30s.  No retractions.  Diffuse and mild expiratory wheezes without apparent decreased  air movement.  No focal features. Gastrointestinal: Soft , nondistended, nontender to palpation. No CVA tenderness. Musculoskeletal: No lower extremity tenderness nor edema.  No joint effusions. No signs of acute trauma. Neurologic:  Normal speech and  language. No gross focal neurologic deficits are appreciated. No gait instability noted. Skin:  Skin is warm, dry and intact. No rash noted. Psychiatric: Mood and affect are normal. Speech and behavior are normal.  ____________________________________________   LABS (all labs ordered are listed, but only abnormal results are displayed)  Labs Reviewed  SARS CORONAVIRUS 2 BY RT PCR (HOSPITAL ORDER, PERFORMED IN [facility]) - Abnormal; Notable for the following components:      Result Value   SARS Coronavirus 2 POSITIVE (*)    All other components within normal limits  LACTIC ACID, PLASMA - Abnormal; Notable for the following components:   Lactic Acid, Venous 5.1 (*)    All other components within normal limits  CBC WITH DIFFERENTIAL/PLATELET - Abnormal; Notable for the following components:   RBC 4.15 (*)    HCT 37.7 (*)    Platelets 129 (*)    Lymphs Abs 0.6 (*)    All other components within normal limits  COMPREHENSIVE METABOLIC PANEL - Abnormal; Notable for the following components:   Sodium 127 (*)    Potassium 3.3 (*)    Chloride 91 (*)    Glucose, Bld 189 (*)    Calcium 8.4 (*)    Albumin 3.4 (*)    AST 47 (*)    Alkaline Phosphatase 35 (*)    All other components within normal limits  MAGNESIUM - Abnormal; Notable for the following components:   Magnesium 1.5 (*)    All other components within normal limits  TROPONIN I (HIGH SENSITIVITY) - Abnormal; Notable for the following components:   Troponin I (High Sensitivity) 62 (*)    All other components within normal limits  CULTURE, BLOOD (ROUTINE X 2)  CULTURE, BLOOD (ROUTINE X 2)  URINE CULTURE  PROTIME-INR  APTT  LIPASE, BLOOD  LACTIC ACID, PLASMA  URINALYSIS, COMPLETE (UACMP) WITH MICROSCOPIC   ____________________________________________  12 Lead EKG  Sinus rhythm, rate of 115 bpm.  Normal axis.  Incomplete right bundle.  Otherwise normal intervals.  Inferior T wave inversions without STEMI  criteria. ____________________________________________  RADIOLOGY  ED MD interpretation: 1 view CXR reviewed by me with hyperinflation, no discrete lobar filtration  Official radiology report(s): DG Chest Port 1 View  Result Date: 08/18/2020 CLINICAL DATA:  Weakness.  Cough. EXAM: PORTABLE CHEST 1 VIEW COMPARISON:  August 02, 2017 FINDINGS: There is scarring and atelectasis at the lung bases, not substantially changed from prior study. There are few rounded opacities at the right lower lung zone that appear to be new since prior study. There is a pulmonary nodule in the left mid lung zone. There is no pneumothorax. No large pleural effusion. The heart size is stable. Aortic calcifications are noted. There is no acute osseous abnormality. IMPRESSION: 1. No definite acute cardiopulmonary process. 2. Bilateral rounded pulmonary opacities that appear to be new since 2019. Follow-up with an outpatient CT is recommended for further evaluation. Electronically Signed   By: Katherine Mantle M.D.   On: 08/18/2020 01:59    ____________________________________________   PROCEDURES and INTERVENTIONS  Procedure(s) performed (including Critical Care):  .1-3 Lead EKG Interpretation Performed by: Delton Prairie, MD Authorized by: Delton Prairie, MD     Interpretation: abnormal     ECG rate:  115  ECG rate assessment: tachycardic     Rhythm: sinus tachycardia     Ectopy: none     Conduction: normal   .Critical Care Performed by: Delton Prairie, MD Authorized by: Delton Prairie, MD   Critical care provider statement:    Critical care time (minutes):  45   Critical care was necessary to treat or prevent imminent or life-threatening deterioration of the following conditions:  Sepsis and respiratory failure   Critical care was time spent personally by me on the following activities:  Discussions with consultants, evaluation of patient's response to treatment, examination of patient, ordering and  performing treatments and interventions, ordering and review of laboratory studies, ordering and review of radiographic studies, pulse oximetry, re-evaluation of patient's condition, obtaining history from patient or surrogate and review of old charts    Medications  lactated ringers infusion ( Intravenous New Bag/Given 08/18/20 0352)  cefTRIAXone (ROCEPHIN) 2 g in sodium chloride 0.9 % 100 mL IVPB (0 g Intravenous Stopped 08/18/20 0240)  azithromycin (ZITHROMAX) 500 mg in sodium chloride 0.9 % 250 mL IVPB (0 mg Intravenous Stopped 08/18/20 0348)  remdesivir 200 mg in sodium chloride 0.9% 250 mL IVPB (has no administration in time range)    Followed by  remdesivir 100 mg in sodium chloride 0.9 % 100 mL IVPB (has no administration in time range)  magnesium sulfate IVPB 2 g 50 mL (has no administration in time range)  potassium chloride SA (KLOR-CON) CR tablet 40 mEq (has no administration in time range)  lactated ringers bolus 2,000 mL (0 mLs Intravenous Stopped 08/18/20 0320)  acetaminophen (TYLENOL) tablet 1,000 mg (1,000 mg Oral Given 08/18/20 0155)  dexamethasone (DECADRON) injection 6 mg (6 mg Intravenous Given 08/18/20 0241)  chlordiazePOXIDE (LIBRIUM) capsule 25 mg (25 mg Oral Given 08/18/20 0241)    ____________________________________________   MDM / ED COURSE   Unvaccinated 65 year old male presents to the ED with evidence of sepsis and COVID-19 requiring medical admission.  Febrile, tachycardic and tachypneic, but hemodynamically stable and not hypoxic.  Sats 91-93% on room air.  Exam with uncomfortable-appearing patient who is tachypneic and dyspneic, but not distressed.  No evidence of neurovascular deficits or trauma.  He is tachycardic, but has no significant tremors or tongue fasciculations or delirium to suggest DTs.  Likely withdrawing from alcohol and so was provided Librium, after this and fluid resuscitation his tachycardia resolves.  Sepsis protocols followed and CAP coverage  provided.  CXR shows no discrete infiltration, his clinical picture is most consistent with pulmonary etiology of sepsis.  We will admit to hospitalist medicine for further work-up and management.   Clinical Course as of 08/18/20 0355  Thu Aug 18, 2020  0216 Reassessed.  Patient complaining of chills.  I provided warm blankets.  Oxygen sats of about 92% on room air.  Increased work of breathing continues. [DS]  0218 Informed of lactic of 5.1.  Continues to be normotensive without signs of shock [DS]    Clinical Course User Index [DS] Delton Prairie, MD    ____________________________________________   FINAL CLINICAL IMPRESSION(S) / ED DIAGNOSES  Final diagnoses:  Sepsis with acute hypoxic respiratory failure without septic shock, due to unspecified organism [facility])  COVID-19  Alcohol abuse     ED Discharge Orders    None       Arda Keadle   Note:  This document was prepared using Dragon voice recognition software and may include unintentional dictation errors.   Delton Prairie, MD 08/18/20 [id number]

## 2020-08-18 NOTE — ED Notes (Signed)
Pt oob to br with 1 assist.

## 2020-08-18 NOTE — ED Notes (Signed)
Pt resting. Vss. NAD

## 2020-08-18 NOTE — ED Notes (Signed)
On assessment, pt found to be covered in stool all over legs.  Per ems, this is not normal for pt.

## 2020-08-18 NOTE — ED Notes (Signed)
Date and time results received: 08/18/20 0217 (use smartphrase ".now" to insert current time)  Test: lactic acid Critical Value: 5.1  Name of Provider Notified: Adaline Sill, MD  Orders Received? Or Actions Taken?: Orders Received - See Orders for details

## 2020-08-19 DIAGNOSIS — U071 COVID-19: Secondary | ICD-10-CM

## 2020-08-19 DIAGNOSIS — J1282 Pneumonia due to coronavirus disease 2019: Secondary | ICD-10-CM

## 2020-08-19 DIAGNOSIS — J189 Pneumonia, unspecified organism: Secondary | ICD-10-CM

## 2020-08-19 LAB — CBC WITH DIFFERENTIAL/PLATELET
Abs Immature Granulocytes: 0.02 10*3/uL (ref 0.00–0.07)
Basophils Absolute: 0 10*3/uL (ref 0.0–0.1)
Basophils Relative: 0 %
Eosinophils Absolute: 0 10*3/uL (ref 0.0–0.5)
Eosinophils Relative: 0 %
HCT: 35.3 % — ABNORMAL LOW (ref 39.0–52.0)
Hemoglobin: 12.6 g/dL — ABNORMAL LOW (ref 13.0–17.0)
Immature Granulocytes: 0 %
Lymphocytes Relative: 15 %
Lymphs Abs: 1 10*3/uL (ref 0.7–4.0)
MCH: 32.4 pg (ref 26.0–34.0)
MCHC: 35.7 g/dL (ref 30.0–36.0)
MCV: 90.7 fL (ref 80.0–100.0)
Monocytes Absolute: 0.4 10*3/uL (ref 0.1–1.0)
Monocytes Relative: 5 %
Neutro Abs: 5.7 10*3/uL (ref 1.7–7.7)
Neutrophils Relative %: 80 %
Platelets: 133 10*3/uL — ABNORMAL LOW (ref 150–400)
RBC: 3.89 MIL/uL — ABNORMAL LOW (ref 4.22–5.81)
RDW: 14.2 % (ref 11.5–15.5)
WBC: 7.1 10*3/uL (ref 4.0–10.5)
nRBC: 0 % (ref 0.0–0.2)

## 2020-08-19 LAB — COMPREHENSIVE METABOLIC PANEL
ALT: 21 U/L (ref 0–44)
AST: 76 U/L — ABNORMAL HIGH (ref 15–41)
Albumin: 3.2 g/dL — ABNORMAL LOW (ref 3.5–5.0)
Alkaline Phosphatase: 32 U/L — ABNORMAL LOW (ref 38–126)
Anion gap: 13 (ref 5–15)
BUN: 18 mg/dL (ref 8–23)
CO2: 26 mmol/L (ref 22–32)
Calcium: 8.4 mg/dL — ABNORMAL LOW (ref 8.9–10.3)
Chloride: 95 mmol/L — ABNORMAL LOW (ref 98–111)
Creatinine, Ser: 0.89 mg/dL (ref 0.61–1.24)
GFR, Estimated: >60 mL/min (ref >60)
Glucose, Bld: 149 mg/dL — ABNORMAL HIGH (ref 70–99)
Potassium: 3.7 mmol/L (ref 3.5–5.1)
Sodium: 134 mmol/L — ABNORMAL LOW (ref 135–145)
Total Bilirubin: 0.5 mg/dL (ref 0.3–1.2)
Total Protein: 6.9 g/dL (ref 6.5–8.1)

## 2020-08-19 LAB — ECHOCARDIOGRAM COMPLETE
AR max vel: 4.59 cm2
AV Area VTI: 4.53 cm2
AV Area mean vel: 4.9 cm2
AV Mean grad: 3 mmHg
AV Peak grad: 5.6 mmHg
Ao pk vel: 1.18 m/s
Area-P 1/2: 7.51 cm2
Height: 75 in
MV VTI: 4.23 cm2
S' Lateral: 2.7 cm
Weight: 2960 oz

## 2020-08-19 LAB — FERRITIN: Ferritin: 1695 ng/mL — ABNORMAL HIGH (ref 24–336)

## 2020-08-19 LAB — C-REACTIVE PROTEIN: CRP: 17.2 mg/dL — ABNORMAL HIGH (ref <1.0)

## 2020-08-19 LAB — URINE CULTURE: Culture: NO GROWTH

## 2020-08-19 LAB — FIBRIN DERIVATIVES D-DIMER (ARMC ONLY): Fibrin derivatives D-dimer (ARMC): 2056.59 ng/mL (FEU) — ABNORMAL HIGH (ref 0.00–499.00)

## 2020-08-19 LAB — MAGNESIUM: Magnesium: 1.9 mg/dL (ref 1.7–2.4)

## 2020-08-19 LAB — PHOSPHORUS: Phosphorus: 3.3 mg/dL (ref 2.5–4.6)

## 2020-08-19 MED ORDER — POTASSIUM & SODIUM PHOSPHATES 280-160-250 MG PO PACK
2.0000 | PACK | Freq: Three times a day (TID) | ORAL | Status: DC
  Filled 2020-08-19 (×3): qty 2

## 2020-08-19 MED ORDER — DEXAMETHASONE 4 MG PO TABS
6.0000 mg | ORAL_TABLET | Freq: Every day | ORAL | Status: DC
Start: 2020-08-19 — End: 2020-08-20
  Administered 2020-08-19 – 2020-08-20 (×2): 6 mg via ORAL
  Filled 2020-08-19 (×2): qty 2

## 2020-08-19 NOTE — Progress Notes (Signed)
PROGRESS NOTE    Gary Benton.  XLK:[id number] DOB: [birth year] DOA: 08/18/2020 PCP: [facility]   Chief complaint.  Generalized weakness. Brief Narrative:  ThomasMaultsbyis a64 y.o.Caucasian malewith a known history of hypertension, ongoing tobacco and alcohol abuse, who presented to the emergency room with acute onset of worsening dyspnea with productive cough of yellowish sputum as well as generalized weakness which has been going on over the last couple weeks.  Upon arriving the emergency room, his oxygen saturation was 87%, was placed on 2 L oxygen.  Patient positive for Covid.  Chest x-ray showed a possible lung nodule.  Patient is treated with steroids and remdesivir.  Patient also has elevated procalcitonin level, treated with Rocephin and Zithromax for bacterial pneumonia.   Assessment & Plan:   Active Problems:   Sepsis due to COVID-19 (HCC)   Lung nodule  #1. acute hypoxemic respite failure secondary to Covid pneumonia and a secondary bacterial pneumonia. Sepsis secondary to bacterial pneumonia. Covid pneumonia. Secondary bacterial pneumonia. Likely acute on chronic diastolic congestive heart failure. Condition stabilized, patient is off oxygen. Patient still pending echocardiogram results.  Received Lasix, volume status much better. Continue antibiotics with Rocephin and Zithromax. Blood cultures Benton far negative.  #2.  Alcohol abuse. No evidence of withdrawal.  3.  Hyponatremia, hypokalemia and hypomagnesemia. Condition improved.  4.  Mild thrombocytopenia. Stable      DVT prophylaxis: Lovenox Code Status: Full Family Communication: Friend listed as contact, did not call Disposition Plan:  .   Status is: Inpatient  Remains inpatient appropriate because:Inpatient level of care appropriate due to severity of illness   Dispo: The patient is from: Home              Anticipated d/c is to: Home              Anticipated  d/c date is: 1 day              Patient currently is not medically stable to d/c.   Difficult to place patient No        I/O last 3 completed shifts: In: 3040 [IV Piggyback:3040] Out: 500 [Urine:500] No intake/output data recorded.     Consultants:   None  Procedures: None  Antimicrobials:  Rocephin and Zithromax.  Subjective: Doing well today, off oxygen.  Some short of breath with exertion. No fever chills per No abdominal pain or nausea vomiting. No chest pain palpitation.  Objective: Vitals:   08/18/20 2339 08/19/20 0330 08/19/20 0350 08/19/20 0759  BP: 125/85  129/64 107/62  Pulse: 80 77 73 78  Resp: 19  15 16   Temp: 97.9 F (36.6 C)  99 F (37.2 C) 98.1 F (36.7 C)  TempSrc: Oral  Oral   SpO2: 97%  97% 96%  Weight:      Height:        Intake/Output Summary (Last 24 hours) at 08/19/2020 1115 Last data filed at 08/19/2020 0500 Gross per 24 hour  Intake 350.21 ml  Output --  Net 350.21 ml   Filed Weights   08/18/20 0128  Weight: 83.9 kg    Examination:  General exam: Appears calm and comfortable  Respiratory system: Clear to auscultation. Respiratory effort normal. Cardiovascular system: S1 & S2 heard, RRR. No JVD, murmurs, rubs, gallops or clicks. No pedal edema. Gastrointestinal system: Abdomen is nondistended, soft and nontender. No organomegaly or masses felt. Normal bowel sounds heard. Central nervous system: Alert and oriented. No focal neurological deficits.  Extremities: Symmetric  Skin: No rashes, lesions or ulcers Psychiatry: Mood & affect appropriate.     Data Reviewed: I have personally reviewed following labs and imaging studies  CBC: Recent Labs  Lab 08/18/20 0142 08/18/20 1813 08/19/20 0447  WBC 8.1 7.4 7.1  NEUTROABS 6.8  --  5.7  HGB 13.5 13.5 12.6*  HCT 37.7* 36.9* 35.3*  MCV 90.8 89.1 90.7  PLT 129* 146* 133*   Basic Metabolic Panel: Recent Labs  Lab 08/18/20 0243 08/18/20 1813 08/19/20 0447  NA 127*  --   134*  K 3.3*  --  3.7  CL 91*  --  95*  CO2 22  --  26  GLUCOSE 189*  --  149*  BUN 21  --  18  CREATININE 0.91  --  0.89  CALCIUM 8.4*  --  8.4*  MG 1.5* 2.1 1.9  PHOS  --  2.0* 3.3   GFR: Estimated Creatinine Clearance: 100.8 mL/min (by C-G formula based on SCr of 0.89 mg/dL). Liver Function Tests: Recent Labs  Lab 08/18/20 0243 08/19/20 0447  AST 47* 76*  ALT 18 21  ALKPHOS 35* 32*  BILITOT 0.8 0.5  PROT 7.2 6.9  ALBUMIN 3.4* 3.2*   Recent Labs  Lab 08/18/20 0243  LIPASE 28   No results for input(s): AMMONIA in the last 168 hours. Coagulation Profile: Recent Labs  Lab 08/18/20 0142  INR 1.1   Cardiac Enzymes: No results for input(s): CKTOTAL, CKMB, CKMBINDEX, TROPONINI in the last 168 hours. BNP (last 3 results) No results for input(s): PROBNP in the last 8760 hours. HbA1C: No results for input(s): HGBA1C in the last 72 hours. CBG: Recent Labs  Lab 08/18/20 2041  GLUCAP 163*   Lipid Profile: No results for input(s): CHOL, HDL, LDLCALC, TRIG, CHOLHDL, LDLDIRECT in the last 72 hours. Thyroid Function Tests: No results for input(s): TSH, T4TOTAL, FREET4, T3FREE, THYROIDAB in the last 72 hours. Anemia Panel: Recent Labs    08/18/20 0633 08/19/20 0447  FERRITIN 1,539* 1,695*   Sepsis Labs: Recent Labs  Lab 08/18/20 0142 08/18/20 0243 08/18/20 0349 08/18/20 0633  PROCALCITON  --  2.58  --  5.26  LATICACIDVEN 5.1*  --  1.2  --     Recent Results (from the past 240 hour(s))  Blood Culture (routine x 2)     Status: None (Preliminary result)   Collection Time: 08/18/20  1:43 AM   Specimen: BLOOD  Result Value Ref Range Status   Specimen Description BLOOD RIGHT ANTECUBITAL  Final   Special Requests   Final    BOTTLES DRAWN AEROBIC AND ANAEROBIC Blood Culture results may not be optimal due to an excessive volume of blood received in culture bottles   Culture   Final    NO GROWTH 1 DAY Performed at [facility], [address]  [place], [place] [address]    Report Status PENDING  Incomplete  Blood Culture (routine x 2)     Status: None (Preliminary result)   Collection Time: 08/18/20  1:43 AM   Specimen: BLOOD  Result Value Ref Range Status   Specimen Description BLOOD WRIST  Final   Special Requests   Final    BOTTLES DRAWN AEROBIC AND ANAEROBIC Blood Culture results may not be optimal due to an inadequate volume of blood received in culture bottles   Culture  Setup Time PENDING  Incomplete   Culture   Final    NO GROWTH 1 DAY Performed at [facility],  [address] [place], [place] [address]    Report Status PENDING  Incomplete  SARS Coronavirus 2 by RT PCR (hospital order, performed in [facility] hospital lab) Nasopharyngeal Nasopharyngeal Swab     Status: Abnormal   Collection Time: 08/18/20  1:43 AM   Specimen: Nasopharyngeal Swab  Result Value Ref Range Status   SARS Coronavirus 2 POSITIVE (A) NEGATIVE Final    Comment: RESULT CALLED TO, READ BACK BY AND VERIFIED WITH: AUSTIN REEVES @0249  08/18/20 RH (NOTE) SARS-CoV-2 target nucleic acids are DETECTED  SARS-CoV-2 RNA is generally detectable in upper respiratory specimens  during the acute phase of infection.  Positive results are indicative  of the presence of the identified virus, but do not rule out bacterial infection or co-infection with other pathogens not detected by the test.  Clinical correlation with patient history and  other diagnostic information is necessary to determine patient infection status.  The expected result is negative.  Fact Sheet for Patients:   08/20/20   Fact Sheet for Healthcare Providers:   BoilerBrush.com.cy    This test is not yet approved or cleared by the https://pope.com/ FDA and  has been authorized for detection and/or diagnosis of SARS-CoV-2 by FDA under an Emergency Use Authorization (EUA).  This EUA will remain in effect (meaning this test c an  be used) for the duration of  the COVID-19 declaration under Section 564(b)(1) of the Act, 21 U.S.C. section 360-bbb-3(b)(1), unless the authorization is terminated or revoked sooner.  Performed at [facility], [address] [place], [place]   Urine culture     Status: None   Collection Time: 08/18/20  3:49 AM   Specimen: In/Out Cath Urine  Result Value Ref Range Status   Specimen Description   Final    IN/OUT CATH URINE Performed at [facility], [address], [place], [place]    Special Requests   Final    NONE Performed at [facility], [address], [place], [place]    Culture   Final    NO GROWTH Performed at [facility], [address] [facility]. [address], [place], [place]    Report Status 08/19/2020 FINAL  Final         Radiology Studies: DG Chest Port 1 View  Result Date: 08/18/2020 CLINICAL DATA:  Weakness.  Cough. EXAM: PORTABLE CHEST 1 VIEW COMPARISON:  August 02, 2017 FINDINGS: There is scarring and atelectasis at the lung bases, not substantially changed from prior study. There are few rounded opacities at the right lower lung zone that appear to be new since prior study. There is a pulmonary nodule in the left mid lung zone. There is no pneumothorax. No large pleural effusion. The heart size is stable. Aortic calcifications are noted. There is no acute osseous abnormality. IMPRESSION: 1. No definite acute cardiopulmonary process. 2. Bilateral rounded pulmonary opacities that appear to be new since 2019. Follow-up with an outpatient CT is recommended for further evaluation. Electronically Signed   By: 2020 M.D.   On: 08/18/2020 01:59        Scheduled Meds: . amLODipine  5 mg Oral Daily  . vitamin C  500 mg Oral Daily  . aspirin EC  81 mg Oral Daily  . cholecalciferol  1,000 Units Oral Daily  . dexamethasone  6 mg Oral Daily  . enoxaparin (LOVENOX) injection  40 mg Subcutaneous  Q24H  . famotidine  20 mg Oral BID  .  folic acid  1 mg Oral Daily  . losartan  25 mg Oral Daily  . mirtazapine  15 mg Oral QHS  . multivitamin with minerals  1 tablet Oral Daily  . potassium & sodium phosphates  2 packet Oral TID AC & HS  . thiamine  100 mg Oral Daily  . zinc sulfate  220 mg Oral Daily   Continuous Infusions: . azithromycin Stopped (08/19/20 1048)  . cefTRIAXone (ROCEPHIN)  IV Stopped (08/19/20 1006)  . remdesivir 100 mg in NS 100 mL 100 mg (08/19/20 0954)     LOS: 1 day    Time spent: 27 minutes    Marrion Coy, MD [facility]   To contact the attending provider between 7A-7P or the covering provider during after hours 7P-7A, please log into the web site [email or web address] and access using universal [place] password for that web site. If you do not have the password, please call the hospital operator.  08/19/2020, 11:15 AM

## 2020-08-20 DIAGNOSIS — R911 Solitary pulmonary nodule: Secondary | ICD-10-CM

## 2020-08-20 LAB — COMPREHENSIVE METABOLIC PANEL
ALT: 21 U/L (ref 0–44)
AST: 53 U/L — ABNORMAL HIGH (ref 15–41)
Albumin: 3.1 g/dL — ABNORMAL LOW (ref 3.5–5.0)
Alkaline Phosphatase: 29 U/L — ABNORMAL LOW (ref 38–126)
Anion gap: 10 (ref 5–15)
BUN: 21 mg/dL (ref 8–23)
CO2: 25 mmol/L (ref 22–32)
Calcium: 8.6 mg/dL — ABNORMAL LOW (ref 8.9–10.3)
Chloride: 98 mmol/L (ref 98–111)
Creatinine, Ser: 0.62 mg/dL (ref 0.61–1.24)
GFR, Estimated: >60 mL/min (ref >60)
Glucose, Bld: 150 mg/dL — ABNORMAL HIGH (ref 70–99)
Potassium: 3.4 mmol/L — ABNORMAL LOW (ref 3.5–5.1)
Sodium: 133 mmol/L — ABNORMAL LOW (ref 135–145)
Total Bilirubin: 0.4 mg/dL (ref 0.3–1.2)
Total Protein: 6.4 g/dL — ABNORMAL LOW (ref 6.5–8.1)

## 2020-08-20 LAB — CBC WITH DIFFERENTIAL/PLATELET
Abs Immature Granulocytes: 0.03 10*3/uL (ref 0.00–0.07)
Basophils Absolute: 0 10*3/uL (ref 0.0–0.1)
Basophils Relative: 0 %
Eosinophils Absolute: 0 10*3/uL (ref 0.0–0.5)
Eosinophils Relative: 0 %
HCT: 34.3 % — ABNORMAL LOW (ref 39.0–52.0)
Hemoglobin: 12 g/dL — ABNORMAL LOW (ref 13.0–17.0)
Immature Granulocytes: 0 %
Lymphocytes Relative: 13 %
Lymphs Abs: 0.9 10*3/uL (ref 0.7–4.0)
MCH: 32.3 pg (ref 26.0–34.0)
MCHC: 35 g/dL (ref 30.0–36.0)
MCV: 92.2 fL (ref 80.0–100.0)
Monocytes Absolute: 0.6 10*3/uL (ref 0.1–1.0)
Monocytes Relative: 9 %
Neutro Abs: 5.4 10*3/uL (ref 1.7–7.7)
Neutrophils Relative %: 78 %
Platelets: 149 10*3/uL — ABNORMAL LOW (ref 150–400)
RBC: 3.72 MIL/uL — ABNORMAL LOW (ref 4.22–5.81)
RDW: 14.2 % (ref 11.5–15.5)
WBC: 7 10*3/uL (ref 4.0–10.5)
nRBC: 0 % (ref 0.0–0.2)

## 2020-08-20 LAB — CULTURE, BLOOD (ROUTINE X 2)

## 2020-08-20 LAB — C-REACTIVE PROTEIN: CRP: 8.2 mg/dL — ABNORMAL HIGH (ref <1.0)

## 2020-08-20 LAB — PHOSPHORUS: Phosphorus: 2.8 mg/dL (ref 2.5–4.6)

## 2020-08-20 LAB — MAGNESIUM: Magnesium: 1.8 mg/dL (ref 1.7–2.4)

## 2020-08-20 LAB — FIBRIN DERIVATIVES D-DIMER (ARMC ONLY): Fibrin derivatives D-dimer (ARMC): 1352.55 ng/mL (FEU) — ABNORMAL HIGH (ref 0.00–499.00)

## 2020-08-20 MED ORDER — POTASSIUM CHLORIDE 20 MEQ PO PACK: 20.0000 meq | PACK | Freq: Every day | ORAL | 0 refills | Status: AC

## 2020-08-20 MED ORDER — MAGNESIUM SULFATE 2 GM/50ML IV SOLN: 2.0000 g | Freq: Once | INTRAVENOUS | Status: DC

## 2020-08-20 MED ORDER — ASCORBIC ACID 500 MG PO TABS: 500.0000 mg | ORAL_TABLET | Freq: Every day | ORAL | 0 refills | Status: AC

## 2020-08-20 MED ORDER — POTASSIUM CHLORIDE 20 MEQ PO PACK
40.0000 meq | PACK | Freq: Once | ORAL | Status: AC
  Administered 2020-08-20: 11:00:00 40 meq via ORAL
  Filled 2020-08-20: qty 2

## 2020-08-20 MED ORDER — THIAMINE HCL 100 MG PO TABS: 100.0000 mg | ORAL_TABLET | Freq: Every day | ORAL | 0 refills | Status: AC

## 2020-08-20 MED ORDER — POTASSIUM CHLORIDE 20 MEQ PO PACK
20.0000 meq | PACK | Freq: Every day | ORAL | Status: DC
  Administered 2020-08-20: 11:00:00 20 meq via ORAL

## 2020-08-20 MED ORDER — CEFDINIR 300 MG PO CAPS: 300.0000 mg | ORAL_CAPSULE | Freq: Two times a day (BID) | ORAL | 0 refills | Status: AC

## 2020-08-20 MED ORDER — ZINC SULFATE 220 (50 ZN) MG PO CAPS: 220.0000 mg | ORAL_CAPSULE | Freq: Every day | ORAL | 0 refills | Status: AC

## 2020-08-20 MED ORDER — CEFDINIR 300 MG PO CAPS
300.0000 mg | ORAL_CAPSULE | Freq: Two times a day (BID) | ORAL | Status: DC
  Filled 2020-08-20: qty 1

## 2020-08-20 MED ORDER — DEXAMETHASONE 6 MG PO TABS: 6.0000 mg | ORAL_TABLET | Freq: Every day | ORAL | 0 refills | Status: AC

## 2020-08-20 MED ORDER — AZITHROMYCIN 500 MG PO TABS: 500.0000 mg | ORAL_TABLET | Freq: Every day | ORAL | 0 refills | Status: AC

## 2020-08-20 NOTE — Discharge Instructions (Signed)

## 2020-08-20 NOTE — Discharge Summary (Addendum)
Physician Discharge Summary  Patient ID: Gary Benton. MRN: [id number] DOB/AGE: 1956/02/14 65 y.o.  Admit date: 08/18/2020 Discharge date: 08/20/2020  Admission Diagnoses:  Discharge Diagnoses:  Active Problems:   Sepsis due to COVID-19 [facility])   Lung nodule   Pneumonia due to COVID-19 virus   CAP (community acquired pneumonia)   Discharged Condition: good  Hospital Course:  ThomasMaultsbyis a65 y.o.Caucasian malewith a known history of hypertension, ongoing tobacco and alcohol abuse, who presented to the emergency room with acute onset of worsening dyspnea with productive cough of yellowish sputum as well as generalized weakness which has been going on over the last couple weeks.Upon arriving the emergency room, his oxygen saturation was 87%, was placed on 2 L oxygen. Patient positive for Covid. Chest x-ray showed a possible lung nodule.Patient is treated with steroids and remdesivir.  Patient also has elevated procalcitonin level, treated with Rocephin and Zithromax for bacterial pneumonia.  #1. acute hypoxemic respite failure secondary to Covid pneumonia and  secondary bacterial pneumonia. Sepsis secondary to bacterial pneumonia. Covid pneumonia. Secondary bacterial pneumonia. acute on chronic diastolic congestive heart failure. Ruled in. POA.  Condition stabilized, patient is off oxygen. Echocardiogram showed normal ejection fraction with diastolic dysfunction. Received Lasix, volume status much better. Received antibiotics with Rocephin and Zithromax.  We will continue to finish up 7-day course of cefdinir and 5-day course of Zithromax. Blood cultures Benton far negative.  #2.  Alcohol abuse. No evidence of withdrawal.  3.  Hyponatremia, hypokalemia and hypomagnesemia. Still has mild hypokalemia.  Will give additional 40 mEq oral.  Continues 3 days supplement with 20 mg daily.  4.  Mild thrombocytopenia. Stable, secondary to alcohol drinking.  5. Lung  nodule. Incidental finding on x ray, please follow with imaging study in 3 months.   Consults: None  Significant Diagnostic Studies:  PORTABLE CHEST 1 VIEW  COMPARISON:  August 02, 2017  FINDINGS: There is scarring and atelectasis at the lung bases, not substantially changed from prior study. There are few rounded opacities at the right lower lung zone that appear to be new since prior study. There is a pulmonary nodule in the left mid lung zone. There is no pneumothorax. No large pleural effusion. The heart size is stable. Aortic calcifications are noted. There is no acute osseous abnormality.  IMPRESSION: 1. No definite acute cardiopulmonary process. 2. Bilateral rounded pulmonary opacities that appear to be new since 2019. Follow-up with an outpatient CT is recommended for further evaluation.   Electronically Signed   By: Katherine Mantle M.D.   On: 08/18/2020 01:59  Echo: 1. Left ventricular ejection fraction, by estimation, is 55 to 60%. The left ventricle has normal function. The left ventricle demonstrates regional wall motion abnormalities (see scoring diagram/findings for description). Left ventricular diastolic parameters are consistent with Grade I diastolic dysfunction (impaired relaxation). 2. Right ventricular systolic function is normal. The right ventricular size is normal. 3. The mitral valve is normal in structure. No evidence of mitral valve regurgitation. 4. The aortic valve is grossly normal. Aortic valve regurgitation is not visualized.  Treatments: antibiotics, steroids, remdesivir  Discharge Exam: Blood pressure 116/74, pulse 64, temperature 97.9 F (36.6 C), resp. rate 16, height 6\' 3"  (1.905 m), weight 83.9 kg, SpO2 91 %. General appearance: alert and cooperative Resp: clear to auscultation bilaterally Cardio: regular rate and rhythm, S1, S2 normal, no murmur, click, rub or gallop GI: soft, non-tender; bowel sounds normal; no masses,   no organomegaly Extremities: extremities normal, atraumatic, no cyanosis  or edema  Disposition: Discharge disposition: 01-Home or Self Care       Discharge Instructions    Diet - low sodium heart healthy   Complete by: As directed    Increase activity slowly   Complete by: As directed      Allergies as of 08/20/2020   No Known Allergies     Medication List    TAKE these medications   amLODipine 5 MG tablet Commonly known as: NORVASC Take 5 mg by mouth daily.   ascorbic acid 500 MG tablet Commonly known as: VITAMIN C Take 1 tablet (500 mg total) by mouth daily for 14 days. Start taking on: August 21, 2020   azithromycin 500 MG tablet Commonly known as: Zithromax Take 1 tablet (500 mg total) by mouth daily for 3 days. Take 1 tablet daily for 3 days.   cefdinir 300 MG capsule Commonly known as: OMNICEF Take 1 capsule (300 mg total) by mouth every 12 (twelve) hours for 5 days.   D3-50 1.25 MG (50000 UT) capsule Generic drug: Cholecalciferol Take 50,000 Units by mouth every Sunday.   dexamethasone 6 MG tablet Commonly known as: DECADRON Take 1 tablet (6 mg total) by mouth daily for 8 days. Start taking on: August 21, 2020   losartan 25 MG tablet Commonly known as: COZAAR Take 25 mg by mouth daily.   multivitamin with minerals Tabs tablet Take 1 tablet by mouth daily.   potassium chloride 20 MEQ packet Commonly known as: KLOR-CON Take 20 mEq by mouth daily for 3 days.   ProAir HFA 108 (90 Base) MCG/ACT inhaler Generic drug: albuterol Inhale 2 puffs into the lungs every 4 (four) hours as needed for wheezing or shortness of breath.   Remeron 15 MG tablet Generic drug: mirtazapine Take 15 mg by mouth at bedtime.   thiamine 100 MG tablet Take 1 tablet (100 mg total) by mouth daily for 7 days. Start taking on: August 21, 2020   zinc sulfate 220 (50 Zn) MG capsule Take 1 capsule (220 mg total) by mouth daily for 14 days. Start taking on: August 21, 2020       Follow-up Information    [facility] Follow up in 1 week(s).   Specialty: General Practice Contact information: [address] [place] [address] [phone]              35  minutes Signed: 08/20/2020, 9:25 AM

## 2020-08-23 LAB — CULTURE, BLOOD (ROUTINE X 2): Culture: NO GROWTH

## 2021-04-22 DEATH — deceased
